# Patient Record
Sex: Female | Born: 1970 | Race: Asian | Hispanic: No | Marital: Married | Smoking: Never smoker
Health system: Southern US, Community
[De-identification: ages and names within clinical notes are randomized; demographics above are authoritative.]

## PROBLEM LIST (undated history)

## (undated) DIAGNOSIS — F329 Major depressive disorder, single episode, unspecified: Secondary | ICD-10-CM

## (undated) DIAGNOSIS — I10 Essential (primary) hypertension: Secondary | ICD-10-CM

## (undated) DIAGNOSIS — F32A Depression, unspecified: Secondary | ICD-10-CM

## (undated) HISTORY — PX: BREAST REDUCTION SURGERY: SHX8

---

## 1997-06-04 ENCOUNTER — Other Ambulatory Visit: Admission: RE | Admit: 1997-06-04 | Discharge: 1997-06-04 | Payer: Self-pay | Admitting: Obstetrics and Gynecology

## 2002-02-28 ENCOUNTER — Other Ambulatory Visit: Admission: RE | Admit: 2002-02-28 | Discharge: 2002-02-28 | Payer: Self-pay | Admitting: Obstetrics and Gynecology

## 2011-08-04 ENCOUNTER — Emergency Department (HOSPITAL_COMMUNITY)
Admission: EM | Admit: 2011-08-04 | Discharge: 2011-08-04 | Disposition: A | Discharge status: Home / Self Care | Payer: BC Managed Care – PPO | Attending: Emergency Medicine | Admitting: Emergency Medicine

## 2011-08-04 ENCOUNTER — Encounter (HOSPITAL_COMMUNITY): Payer: Self-pay | Admitting: Emergency Medicine

## 2011-08-04 DIAGNOSIS — R109 Unspecified abdominal pain: Secondary | ICD-10-CM

## 2011-08-04 DIAGNOSIS — R112 Nausea with vomiting, unspecified: Secondary | ICD-10-CM | POA: Insufficient documentation

## 2011-08-04 HISTORY — DX: Essential (primary) hypertension: I10

## 2011-08-04 HISTORY — DX: Depression, unspecified: F32.A

## 2011-08-04 HISTORY — DX: Major depressive disorder, single episode, unspecified: F32.9

## 2011-08-04 LAB — COMPREHENSIVE METABOLIC PANEL
BUN: 10 mg/dL (ref 6–23)
CO2: 27 mEq/L (ref 19–32)
Calcium: 9.3 mg/dL (ref 8.4–10.5)
Creatinine, Ser: 0.62 mg/dL (ref 0.50–1.10)
GFR calc Af Amer: >90 mL/min (ref >90)
GFR calc non Af Amer: >90 mL/min (ref >90)
Glucose, Bld: 130 mg/dL — ABNORMAL HIGH (ref 70–99)

## 2011-08-04 LAB — CBC
HCT: 38.4 % (ref 36.0–46.0)
MCV: 84.2 fL (ref 78.0–100.0)
RDW: 12.5 % (ref 11.5–15.5)
WBC: 9.7 10*3/uL (ref 4.0–10.5)

## 2011-08-04 LAB — DIFFERENTIAL
Basophils Absolute: 0 10*3/uL (ref 0.0–0.1)
Eosinophils Relative: 1 % (ref 0–5)
Lymphocytes Relative: 16 % (ref 12–46)
Lymphs Abs: 1.6 10*3/uL (ref 0.7–4.0)
Monocytes Absolute: 0.4 10*3/uL (ref 0.1–1.0)
Monocytes Relative: 4 % (ref 3–12)

## 2011-08-04 LAB — PREGNANCY, URINE: Preg Test, Ur: NEGATIVE

## 2011-08-04 LAB — URINALYSIS, ROUTINE W REFLEX MICROSCOPIC
Ketones, ur: NEGATIVE mg/dL
Leukocytes, UA: NEGATIVE
Protein, ur: NEGATIVE mg/dL
Urobilinogen, UA: 0.2 mg/dL (ref 0.0–1.0)

## 2011-08-04 LAB — LIPASE, BLOOD: Lipase: 12 U/L (ref 11–59)

## 2011-08-04 MED ORDER — ONDANSETRON 4 MG PO TBDP: 4.0000 mg | ORAL_TABLET | Freq: Three times a day (TID) | ORAL | Status: DC | PRN

## 2011-08-04 MED ORDER — LANSOPRAZOLE 30 MG PO CPDR: 30.0000 mg | DELAYED_RELEASE_CAPSULE | Freq: Every day | ORAL | Status: DC

## 2011-08-04 NOTE — ED Provider Notes (Signed)
History     CSN: [id number]  Arrival date & time 08/04/11  1043   First MD Initiated Contact with Patient 08/04/11 1045      Chief Complaint  Patient presents with  . Abdominal Pain    (Consider location/radiation/quality/duration/timing/severity/associated sxs/prior treatment) HPI Hx from pt. 41yo F with PMH HTN presents with abdominal pain. Pain is located to the epigastrium and radiates to the RUQ; described as sharp in nature. Pain has been intermittent over the past 2 days but she had a severe episode this morning which was accompanied by nausea and vomiting. Pain improved at this time. No known aggravating/alleviating factors; no association with position, movement, or meals. No family members with similar. She denies urinary sx, vaginal bleeding/dc, changes in bowel movements, changes in appetite, f/c. Denies etoh use. No hx abd surgeries.  (Nursing note reviewed - pt denies pain to RLQ).  Past Medical History  Diagnosis Date  . Depression   . Hypertension     Past Surgical History  Procedure Date  . Breast reduction surgery     History reviewed. No pertinent family history.  History  Substance Use Topics  . Smoking status: Not on file  . Smokeless tobacco: Not on file  . Alcohol Use:     OB History    Grav Para Term Preterm Abortions TAB SAB Ect Mult Living                  Review of Systems  Constitutional: Negative for fever, chills and appetite change.  Respiratory: Negative for cough, chest tightness and shortness of breath.   Cardiovascular: Negative for chest pain.  Gastrointestinal: Positive for nausea, vomiting and abdominal pain. Negative for diarrhea and constipation.  Genitourinary: Negative for dysuria, urgency, vaginal bleeding and vaginal discharge.  Musculoskeletal: Negative for myalgias.  Skin: Negative for color change and rash.  Neurological: Negative for dizziness and weakness.    Allergies  Review of patient's allergies indicates  no known allergies.  Home Medications   Current Outpatient Rx  Name Route Sig Dispense Refill  . WELLBUTRIN PO Oral Take by mouth.      BP 146/97  Pulse 57  Resp 18  SpO2 100%  LMP 07/28/2011  Physical Exam  Nursing note and vitals reviewed. Constitutional: She appears well-developed and well-nourished. No distress.  HENT:  Head: Normocephalic and atraumatic.  Mouth/Throat: Oropharynx is clear and moist. No oropharyngeal exudate.  Eyes: EOM are normal.  Neck: Normal range of motion.  Cardiovascular: Normal rate, regular rhythm and normal heart sounds.   Pulmonary/Chest: Effort normal and breath sounds normal. She exhibits no tenderness.  Abdominal: Soft. Bowel sounds are normal.       Mildly tender to epigastrium without guard or rebound Negative murphy's sign  Musculoskeletal: Normal range of motion.  Neurological: She is alert.  Skin: Skin is warm and dry. She is not diaphoretic.  Psychiatric: She has a normal mood and affect.    ED Course  Procedures (including critical care time)  Labs Reviewed  DIFFERENTIAL - Abnormal; Notable for the following:    Neutrophils Relative 79 (*)     All other components within normal limits  COMPREHENSIVE METABOLIC PANEL - Abnormal; Notable for the following:    Glucose, Bld 130 (*)     All other components within normal limits  URINALYSIS, ROUTINE W REFLEX MICROSCOPIC - Abnormal; Notable for the following:    Hgb urine dipstick TRACE (*)     All other components within normal limits  URINE MICROSCOPIC-ADD ON - Abnormal; Notable for the following:    Squamous Epithelial / LPF FEW (*)     Bacteria, UA FEW (*)     All other components within normal limits  CBC  LIPASE, BLOOD  PREGNANCY, URINE   No results found.   1. Abdominal pain       MDM  Pt with pain to epigastrium/RUQ x 2 days, intermittent in nature. On exam, mildly tender to epigastrium. Labs appear reassuring; no leukocytosis, elevated LFTs/lipase, or evidence  UTI. Pt has been pain free since arrival in ED. With this, do not feel that the pt requires imaging at this time. Will tx as gastritis. Pt states she plans to make a followup with a GI MD whom her husband sees. She was instructed to return for worsening/changing pain or new sx; she verbalized understanding and was agreeable.        Grant Fontana, PA-C 08/04/11 1309

## 2011-08-04 NOTE — ED Notes (Signed)
Per EMS, pt was seen at PCP's office this am for abd pain.  Pt c/o RLQ pain radiating to right flank x 2 days, N/V today.  Denies fever.  Pt sent to ED by PCP.

## 2011-08-04 NOTE — ED Notes (Signed)
ZOX:[id number]<AV> Expected date:08/04/11<BR> Expected time:<BR> Means of arrival:<BR> Comments:<BR> M120- abd pain

## 2011-08-04 NOTE — Discharge Instructions (Signed)
Your lab studies appear normal today. You have been given a prescription for Prevacid. Take this daily. Use the Zofran (nausea/vomiting medication) as needed. Return to the ED with worsening or changing abdominal pain, worsening nausea/vomiting, new symptoms, or with any other concerns.

## 2011-08-04 NOTE — ED Provider Notes (Signed)
Medical screening examination/treatment/procedure(s) were performed by non-physician practitioner and as supervising physician I was immediately available for consultation/collaboration.   Dayton Bailiff, MD 08/04/11 [phone]

## 2011-08-05 ENCOUNTER — Ambulatory Visit (INDEPENDENT_AMBULATORY_CARE_PROVIDER_SITE_OTHER): Payer: BC Managed Care – PPO | Admitting: Internal Medicine

## 2011-08-05 VITALS — BP 145/94 | HR 68 | Temp 97.9°F | Resp 16 | Ht 64.0 in | Wt 233.8 lb

## 2011-08-05 DIAGNOSIS — R350 Frequency of micturition: Secondary | ICD-10-CM

## 2011-08-05 DIAGNOSIS — R1084 Generalized abdominal pain: Secondary | ICD-10-CM

## 2011-08-05 DIAGNOSIS — Z6841 Body Mass Index (BMI) 40.0 and over, adult: Secondary | ICD-10-CM

## 2011-08-05 DIAGNOSIS — F418 Other specified anxiety disorders: Secondary | ICD-10-CM

## 2011-08-05 DIAGNOSIS — R3915 Urgency of urination: Secondary | ICD-10-CM

## 2011-08-05 DIAGNOSIS — R109 Unspecified abdominal pain: Secondary | ICD-10-CM

## 2011-08-05 LAB — POCT URINALYSIS DIPSTICK
Bilirubin, UA: NEGATIVE
Glucose, UA: NEGATIVE
Nitrite, UA: NEGATIVE
Spec Grav, UA: <=1.005

## 2011-08-05 LAB — POCT UA - MICROSCOPIC ONLY: Crystals, Ur, HPF, POC: NEGATIVE

## 2011-08-05 MED ORDER — CIPROFLOXACIN HCL 500 MG PO TABS: 500.0000 mg | ORAL_TABLET | Freq: Two times a day (BID) | ORAL | Status: AC

## 2011-08-05 MED ORDER — TRAMADOL HCL 50 MG PO TABS: ORAL_TABLET | ORAL | Status: DC

## 2011-08-05 NOTE — Progress Notes (Signed)
  Subjective:    Patient ID: Robin Mcdaniel, female    DOB: 04-24-1970, 41 y.o.   MRN: [id number]  HPIComplaining of urinary urgency and frequency with right flank pain since late last night No dysuria or hematuria Last menstrual period one week ago within normal limits/no vaginal discharge Was taken to the ER by EMS yesterday for acute abdominal pain This had been building up in the epigastric area over the last 3-1/2 days and was accompanied by vomiting 3 times just today because the pain was so severe She felt distended with a hard belly At the ER her pain disappeared suddenly Lab work is as noted in the record and did not reveal a diagnosis No radiology studies were done    Review of Systems  Constitutional: Negative for unexpected weight change.  HENT: Negative.   Eyes: Negative.   Respiratory: Negative.   Cardiovascular: Negative.   Gastrointestinal: Negative for diarrhea, constipation and blood in stool.       Today there is no nausea and no vomiting today       Objective:   Physical Exam Filed Vitals:   08/05/11 1453  BP: 145/94  Pulse: 68  Temp: 97.9 F (36.6 C)  Resp: 16  BMI 40  Chest is clear/heart regular without murmur Abdomen is soft nontender and nondistended with no organomegaly and no masses No flank pain tenderness to percussion Extremities clear       Assessment & Plan:   1. Urinary frequency  POCT urinalysis dipstick, POCT UA - Microscopic Only, Urine culture  2. Abdominal  pain, other specified site    3. Urinary urgency    4. Flank pain    5. Elevated blood pressure    6. Body mass index (BMI) of 40.0-44.9 in adult    7. Depression with anxiety  Dr. Inis Sizer pristiq   The differential diagnosis for today's visit would include infection versus stone whereas the emergency room visit sounds more like gallstones or kidney stones Urine culture Start Cipro 500 twice a day Okay for tramadol if needed Followup in 48-72 hours if not  resolved with the likelihood of scheduling abdominal ultrasound

## 2011-08-06 ENCOUNTER — Emergency Department (HOSPITAL_COMMUNITY): Payer: BC Managed Care – PPO

## 2011-08-06 ENCOUNTER — Telehealth: Payer: Self-pay

## 2011-08-06 ENCOUNTER — Encounter (HOSPITAL_COMMUNITY): Payer: Self-pay | Admitting: *Deleted

## 2011-08-06 ENCOUNTER — Emergency Department (HOSPITAL_COMMUNITY)
Admission: EM | Admit: 2011-08-06 | Discharge: 2011-08-06 | Disposition: A | Discharge status: Home / Self Care | Payer: BC Managed Care – PPO | Attending: Emergency Medicine | Admitting: Emergency Medicine

## 2011-08-06 DIAGNOSIS — K7689 Other specified diseases of liver: Secondary | ICD-10-CM | POA: Insufficient documentation

## 2011-08-06 DIAGNOSIS — R109 Unspecified abdominal pain: Secondary | ICD-10-CM

## 2011-08-06 DIAGNOSIS — R350 Frequency of micturition: Secondary | ICD-10-CM | POA: Insufficient documentation

## 2011-08-06 DIAGNOSIS — R112 Nausea with vomiting, unspecified: Secondary | ICD-10-CM | POA: Insufficient documentation

## 2011-08-06 LAB — URINE CULTURE
Colony Count: NO GROWTH
Organism ID, Bacteria: NO GROWTH

## 2011-08-06 LAB — URINALYSIS, ROUTINE W REFLEX MICROSCOPIC
Bilirubin Urine: NEGATIVE
Leukocytes, UA: NEGATIVE
Nitrite: NEGATIVE
Specific Gravity, Urine: 1.019 (ref 1.005–1.030)
Urobilinogen, UA: 0.2 mg/dL (ref 0.0–1.0)
pH: 5 (ref 5.0–8.0)

## 2011-08-06 LAB — URINE MICROSCOPIC-ADD ON

## 2011-08-06 MED ORDER — HYDROCODONE-ACETAMINOPHEN 5-325 MG PO TABS: 1.0000 | ORAL_TABLET | ORAL | Status: AC | PRN

## 2011-08-06 MED ORDER — IOHEXOL 300 MG/ML  SOLN
100.0000 mL | Freq: Once | INTRAMUSCULAR | Status: AC | PRN
  Administered 2011-08-06: 100 mL via INTRAVENOUS

## 2011-08-06 MED ORDER — DICYCLOMINE HCL 20 MG PO TABS: 20.0000 mg | ORAL_TABLET | Freq: Two times a day (BID) | ORAL | Status: DC

## 2011-08-06 NOTE — ED Notes (Signed)
Pt finished with contrast. CT notified and states pt should be next on the list.

## 2011-08-06 NOTE — ED Notes (Signed)
Pt c/o right sided abdominal pain and radiates towards back and urinary frequency. Pt states that pain comes and goes. Pt given Cipro and Tramadol at Urgent Care yesterday.

## 2011-08-06 NOTE — ED Provider Notes (Signed)
History     CSN: [id number]  Arrival date & time 08/06/11  1355   First MD Initiated Contact with Patient 08/06/11 1415      Chief Complaint  Patient presents with  . Flank Pain    (Consider location/radiation/quality/duration/timing/severity/associated sxs/prior treatment) HPI Hx from pt. 41yo F who presents with abdominal pain. This has been intermittent in nature since Friday. She was seen here Friday morning by myself for the same; she did not have pain throughout her ED visit. She had very mild epigastric tenderness but labs were unremarkable. She was discharged and treated for possible gastritis.  Pt states she has been taking the medications as prescribed, but the pain has been persistent. States it has been episodic in nature, with her having episodes each morning several hours after eating breakfast. Pain slowly escalates and then becomes sharp and severe, with episodes lasting about 60-90 minutes. No known aggravating or alleviating factors. She feels as if the previous 2 attacks have been more located to the RUQ with radiation to the back on that side. She has had associated nausea and vomiting with these. Denies urinary sx, vaginal bleeding/dc, changes in bowel movements, changes in appetite, f/c. No hx abd surgeries.  She presented to urgent care yesterday and was given prescriptions for Cipro and tramadol. She has been taking these as prescribed which have not been helpful. She had another attack today and went to urgent care but was told to come to ED for possible imaging.  Past Medical History  Diagnosis Date  . Depression   . Hypertension     Past Surgical History  Procedure Date  . Breast reduction surgery     No family history on file.  History  Substance Use Topics  . Smoking status: Never Smoker   . Smokeless tobacco: Not on file  . Alcohol Use: No    OB History    Grav Para Term Preterm Abortions TAB SAB Ect Mult Living                  Review of  Systems Constitutional: Negative for fever, chills and appetite change.  Respiratory: Negative for cough, chest tightness and shortness of breath.  Cardiovascular: Negative for chest pain.  Gastrointestinal: Positive for nausea, vomiting and abdominal pain. Negative for diarrhea and constipation.  Genitourinary: Negative for dysuria, urgency, vaginal bleeding and vaginal discharge.  Musculoskeletal: Negative for myalgias.  Skin: Negative for color change and rash.  Neurological: Negative for dizziness and weakness.     Allergies  Review of patient's allergies indicates no known allergies.  Home Medications   Current Outpatient Rx  Name Route Sig Dispense Refill  . BUPROPION HCL ER (XL) 300 MG PO TB24 Oral Take 300 mg by mouth daily.    Marland Kitchen CIPROFLOXACIN HCL 500 MG PO TABS Oral Take 1 tablet (500 mg total) by mouth 2 (two) times daily. 20 tablet 0  . IBUPROFEN 200 MG PO TABS Oral Take 400 mg by mouth every 8 (eight) hours as needed. For pain.    Marland Kitchen LANSOPRAZOLE 30 MG PO CPDR Oral Take 1 capsule (30 mg total) by mouth daily. 30 capsule 0  . TRAMADOL HCL 50 MG PO TABS  1-2 every 6 hr as needed for pain 30 tablet 0    BP 130/71  Pulse 63  Temp 98.4 F (36.9 C) (Oral)  Resp 16  Wt 230 lb (104.327 kg)  SpO2 100%  LMP 07/28/2011  Physical Exam  Nursing note and vitals  reviewed. Constitutional: She appears well-developed and well-nourished. No distress.  HENT:  Head: Normocephalic and atraumatic.  Mouth/Throat: Oropharynx is clear and moist. No oropharyngeal exudate.  Eyes:       Normal appearance  Neck: Normal range of motion.  Cardiovascular: Normal rate, regular rhythm and normal heart sounds.  Exam reveals no gallop and no friction rub.   No murmur heard. Pulmonary/Chest: Effort normal and breath sounds normal. She exhibits no tenderness.  Abdominal: Soft. Bowel sounds are normal. There is no tenderness. There is no rebound and no guarding.       Abd is soft without focal  tenderness on exam today. Negative Murphy's sign Negative CVA tenderness  Musculoskeletal: Normal range of motion. She exhibits no edema.  Neurological: She is alert.  Skin: Skin is warm and dry. She is not diaphoretic.  Psychiatric: She has a normal mood and affect.    ED Course  Procedures (including critical care time)  Labs Reviewed  URINALYSIS, ROUTINE W REFLEX MICROSCOPIC - Abnormal; Notable for the following:    Hgb urine dipstick TRACE (*)     All other components within normal limits  URINE MICROSCOPIC-ADD ON - Abnormal; Notable for the following:    Squamous Epithelial / LPF FEW (*)     All other components within normal limits   US Abdomen Complete  08/06/2011  *RADIOLOGY REPORT*  Clinical Data:  Right upper quadrant pain.  COMPLETE ABDOMINAL ULTRASOUND  Comparison:  None.  Findings:  Gallbladder:  No gallstones, gallbladder wall thickening, or pericholecystic fluid.  Common bile duct:  Between 4 mm and 5 mm, normal.  Liver:  Echogenic liver compared to the adjacent right kidney. Small area of hypo echoic echotexture adjacent to the gallbladder fossa is most compatible with focal fatty sparing.  This is a typical location for fatty sparing.  IVC:  Appears normal.  Pancreas:  No focal abnormality seen.  Spleen:  53 mm.  Normal echotexture.  Right Kidney:  11.8 cm. Normal echotexture.  Normal central sinus echo complex.  No calculi or hydronephrosis.  Left Kidney:  11.8 cm. Normal echotexture.  Normal central sinus echo complex.  No calculi or hydronephrosis.  Abdominal aorta:  No aneurysm identified.  IMPRESSION: 1.  No cholelithiasis or cholecystitis.  Normal common bile duct. 2.  Hepatic steatosis with focal fatty sparing adjacent to the gallbladder fossa.  Original Report Authenticated By: Andreas Newport, M.D.   Ct Abdomen Pelvis W Contrast  08/06/2011  *RADIOLOGY REPORT*  Clinical Data: Urinary urgency and frequency.  Right flank pain.  CT ABDOMEN AND PELVIS WITH CONTRAST   Technique:  Multidetector CT imaging of the abdomen and pelvis was performed following the standard protocol during bolus administration of intravenous contrast.  Contrast: OMNIPAQUE IOHEXOL 300 MG/ML  SOLN  Comparison: None.  Findings: Mild dependent atelectasis at the lung bases.  Low attenuation of the liver suggests hepatic steatosis.  High attenuation of the liver is present adjacent to the gallbladder fossa, the typical location for focal fatty sparing.  The Focal area of either fatty sparing or a hypervascular lesion is present in the posterior right hepatic lobe measuring 42 mm x 46 mm (image 19 series 2).  This does not have a typical appearance of cavernous hemangioma.  Followup MRI recommended.  No calcified gallstones.  The common bile duct and pancreas appear normal. Spleen normal.  The adrenal glands normal.  Early excretion of contrast in the kidneys is present on the left.  Both ureters appear normal.  Normal delayed excretion of contrast from the kidneys.  Stomach and proximal small bowel normal.  Normal appendix.  The colon appears within normal limits.  Uterus and adnexa have a normal physiologic appearance.  Normal urinary bladder.  No adenopathy.  Vasculature grossly normal.  No aggressive osseous lesions are present.  Mild lumbar spondylosis.  IMPRESSION: 1.  No acute abnormality. 2.  46 mm x 42 mm lesion in the posterior right hepatic lobe is nonspecific on CT.  This is indeterminant.  Follow-up MRI with and without infusion recommended for further evaluation. Non-emergent MRI should be deferred until patient has been discharged for the acute illness, and can optimally cooperate with positioning and breath-holding instructions. 3.  Probable fatty liver.  Original Report Authenticated By: Andreas Newport, M.D.     1. Abdominal pain       MDM  Pt with abd pain which has been intermittent in nature since Fri with daily "attacks." Reassuring bloodwork here on Friday, so bloodwork  was not repeated today. Imaging does not show any acute findings such as gallbladder or renal pathology. There is an incidental finding of ?fatty sparing seen on CT which radiology recommends following up with MRI - this does not explain the cause of the patient's pain today. Pt is aware that she will need to follow up with her PCP for this. Rxes for Bentyl, Norco. Instructed to cont Prevacid. Return precautions discussed.        Grant Fontana, PA-C 08/06/11 2035

## 2011-08-06 NOTE — ED Notes (Signed)
Patient transported to CT 

## 2011-08-06 NOTE — ED Notes (Signed)
Pt states "was seen here 1st, then yesterday went to [facility] because the pain came back & was so severe, they gave me 2 medicines that I'm taking, Cipro & tramadol but the pain was again today severe, they told me to come here because they couldn't do sonography"; pt c/o right flank pain

## 2011-08-06 NOTE — ED Notes (Signed)
US finished in room.

## 2011-08-06 NOTE — Discharge Instructions (Signed)
Your imaging today did not show any evidence of gallbladder or kidney problems, which is reassuring. There was an area of possible "fatty sparing" on your liver which you will need to have followed up with an MRI within the next several weeks which can be scheduled by your primary care doctor; this is not the cause of your pain today. If your pain recurs, please take the Bentyl for spasm. You may also take the Norco for pain as well. Start the antacid medication which was prescribed on Friday. Please plan to follow up with your primary care doctor for further evaluation and treatment.

## 2011-08-06 NOTE — Telephone Encounter (Signed)
Pt is in terrible pain and was here yesterday to see dr Merla Riches would like him to call the hospital or him to call her and let her know what to do

## 2011-08-07 NOTE — ED Provider Notes (Signed)
Medical screening examination/treatment/procedure(s) were performed by non-physician practitioner and as supervising physician I was immediately available for consultation/collaboration.  Cheri Guppy, MD 08/07/11 [phone]

## 2011-08-09 ENCOUNTER — Other Ambulatory Visit (HOSPITAL_COMMUNITY): Payer: Self-pay | Admitting: Family Medicine

## 2011-08-09 DIAGNOSIS — R1011 Right upper quadrant pain: Secondary | ICD-10-CM

## 2011-08-15 ENCOUNTER — Other Ambulatory Visit (HOSPITAL_COMMUNITY): Payer: BC Managed Care – PPO

## 2011-08-18 ENCOUNTER — Ambulatory Visit (HOSPITAL_COMMUNITY)
Admission: RE | Admit: 2011-08-18 | Discharge: 2011-08-18 | Disposition: A | Discharge status: Home / Self Care | Payer: BC Managed Care – PPO | Source: Ambulatory Visit | Attending: Family Medicine | Admitting: Family Medicine

## 2011-08-18 DIAGNOSIS — R1011 Right upper quadrant pain: Secondary | ICD-10-CM

## 2011-08-18 MED ORDER — TECHNETIUM TC 99M MEBROFENIN IV KIT
5.0000 | PACK | Freq: Once | INTRAVENOUS | Status: AC | PRN
  Administered 2011-08-18: 5 via INTRAVENOUS

## 2011-08-18 MED ORDER — SINCALIDE 5 MCG IJ SOLR
0.0200 ug/kg | Freq: Once | INTRAMUSCULAR | Status: AC
  Administered 2011-08-18: 2.09 ug via INTRAVENOUS

## 2011-08-18 MED ORDER — SINCALIDE 5 MCG IJ SOLR
INTRAMUSCULAR | Status: AC
  Administered 2011-08-18: 2.09 ug via INTRAVENOUS
  Filled 2011-08-18: qty 10

## 2014-04-13 ENCOUNTER — Other Ambulatory Visit: Payer: Self-pay | Admitting: Obstetrics and Gynecology

## 2014-04-13 DIAGNOSIS — R928 Other abnormal and inconclusive findings on diagnostic imaging of breast: Secondary | ICD-10-CM

## 2014-04-23 ENCOUNTER — Other Ambulatory Visit: Payer: Self-pay | Admitting: Obstetrics and Gynecology

## 2014-04-27 ENCOUNTER — Ambulatory Visit
Admission: RE | Admit: 2014-04-27 | Discharge: 2014-04-27 | Disposition: A | Discharge status: Home / Self Care | Payer: BLUE CROSS/BLUE SHIELD | Source: Ambulatory Visit | Attending: Obstetrics and Gynecology | Admitting: Obstetrics and Gynecology

## 2014-04-27 DIAGNOSIS — R928 Other abnormal and inconclusive findings on diagnostic imaging of breast: Secondary | ICD-10-CM

## 2014-05-21 NOTE — Telephone Encounter (Signed)
Issue was addressed 

## 2015-01-05 ENCOUNTER — Other Ambulatory Visit: Payer: Self-pay | Admitting: Obstetrics and Gynecology

## 2015-01-05 DIAGNOSIS — N6320 Unspecified lump in the left breast, unspecified quadrant: Secondary | ICD-10-CM

## 2015-01-08 ENCOUNTER — Ambulatory Visit
Admission: RE | Admit: 2015-01-08 | Discharge: 2015-01-08 | Disposition: A | Discharge status: Home / Self Care | Payer: BLUE CROSS/BLUE SHIELD | Source: Ambulatory Visit | Attending: Obstetrics and Gynecology | Admitting: Obstetrics and Gynecology

## 2015-01-08 DIAGNOSIS — N6320 Unspecified lump in the left breast, unspecified quadrant: Secondary | ICD-10-CM

## 2015-07-05 ENCOUNTER — Other Ambulatory Visit: Payer: Self-pay | Admitting: Obstetrics and Gynecology

## 2015-07-05 DIAGNOSIS — N63 Unspecified lump in unspecified breast: Secondary | ICD-10-CM

## 2015-07-09 ENCOUNTER — Ambulatory Visit
Admission: RE | Admit: 2015-07-09 | Discharge: 2015-07-09 | Disposition: A | Discharge status: Home / Self Care | Payer: BLUE CROSS/BLUE SHIELD | Source: Ambulatory Visit | Attending: Obstetrics and Gynecology | Admitting: Obstetrics and Gynecology

## 2015-07-09 DIAGNOSIS — N63 Unspecified lump in unspecified breast: Secondary | ICD-10-CM

## 2017-03-12 ENCOUNTER — Encounter: Payer: Self-pay | Admitting: Podiatry

## 2017-03-12 ENCOUNTER — Ambulatory Visit (INDEPENDENT_AMBULATORY_CARE_PROVIDER_SITE_OTHER): Payer: BLUE CROSS/BLUE SHIELD

## 2017-03-12 ENCOUNTER — Ambulatory Visit (INDEPENDENT_AMBULATORY_CARE_PROVIDER_SITE_OTHER): Payer: BLUE CROSS/BLUE SHIELD | Admitting: Podiatry

## 2017-03-12 DIAGNOSIS — M722 Plantar fascial fibromatosis: Secondary | ICD-10-CM | POA: Diagnosis not present

## 2017-03-12 MED ORDER — DICLOFENAC SODIUM 75 MG PO TBEC
75.0000 mg | DELAYED_RELEASE_TABLET | Freq: Two times a day (BID) | ORAL | 2 refills | Status: DC
Start: 2017-03-12 — End: 2018-11-27

## 2017-03-12 MED ORDER — TRIAMCINOLONE ACETONIDE 10 MG/ML IJ SUSP
10.0000 mg | Freq: Once | INTRAMUSCULAR | Status: AC
  Administered 2017-03-12: 10 mg

## 2017-03-12 NOTE — Patient Instructions (Signed)

## 2017-03-14 NOTE — Progress Notes (Signed)
Subjective:   Patient ID: Robin Mcdaniel, female   DOB: 47 y.o.   MRN: [id number]   HPI Patient presents with exquisite discomfort plantar aspect right heel at the insertional point of the tendon into the calcaneus with inflammation and fluid around the medial band.  Patient states this is been going on for a number of months patient does not smoke and likes to be active   Review of Systems  All other systems reviewed and are negative.       Objective:  Physical Exam  Constitutional: She appears well-developed and well-nourished.  Cardiovascular: Intact distal pulses.  Pulmonary/Chest: Effort normal.  Musculoskeletal: Normal range of motion.  Neurological: She is alert.  Skin: Skin is warm.  Nursing note and vitals reviewed.   Neurovascular status intact muscle strength adequate range of motion within normal limits with exquisite discomfort plantar aspect of the right heel at the insertional point tendon calcaneus with fluid buildup noted.  Patient does have moderate depression of the arch and is found to have good digital perfusion     Assessment:  Acute plantar fasciitis right with history of this in the long-term orthotics which are no longer effective     Plan:  H&P x-ray reviewed and today injected the plantar fascia right 3 mg Kenalog 5 mg Xylocaine and applied fascial brace.  Gave instructions on physical therapy discussed long-term orthotics and reappoint to recheck again in the next several weeks  X-rays indicate small spur with no indications of stress fracture or advanced arthritis

## 2017-03-26 ENCOUNTER — Encounter: Payer: Self-pay | Admitting: Podiatry

## 2017-03-26 ENCOUNTER — Ambulatory Visit (INDEPENDENT_AMBULATORY_CARE_PROVIDER_SITE_OTHER): Payer: BLUE CROSS/BLUE SHIELD | Admitting: Podiatry

## 2017-03-26 DIAGNOSIS — M722 Plantar fascial fibromatosis: Secondary | ICD-10-CM | POA: Diagnosis not present

## 2017-03-26 MED ORDER — TRIAMCINOLONE ACETONIDE 10 MG/ML IJ SUSP
10.0000 mg | Freq: Once | INTRAMUSCULAR | Status: AC
  Administered 2017-03-26: 10 mg

## 2017-03-28 NOTE — Progress Notes (Signed)
Subjective:   Patient ID: Robin Mcdaniel, female   DOB: 47 y.o.   MRN: [id number]   HPI Patient presents stating I am improving but still having quite a bit of discomfort and if I am on my foot for too long of a time it still gets very sore.  This has been occurring for about 6 months neurovascular status intact with inflammation pain of the plantar heel right still present   ROS      Objective:  Physical Exam  Upon deep palpation with inflammation fluid around the medial band     Assessment:  Improving but continued plantar fasciitis right with inflammation fluid around the medial band     Plan:  H&P condition reviewed and careful injection administered medial under sterile technique with 3 mg Kenalog 5 mg Xylocaine.  I then discussed orthotics and scan for customized orthotic devices to reduce the stress on the heel

## 2017-04-05 ENCOUNTER — Other Ambulatory Visit: Payer: BLUE CROSS/BLUE SHIELD | Admitting: Orthotics

## 2017-04-13 ENCOUNTER — Other Ambulatory Visit: Payer: Self-pay | Admitting: Obstetrics and Gynecology

## 2017-04-13 DIAGNOSIS — N63 Unspecified lump in unspecified breast: Secondary | ICD-10-CM

## 2017-04-23 ENCOUNTER — Ambulatory Visit: Payer: BLUE CROSS/BLUE SHIELD

## 2017-04-23 ENCOUNTER — Ambulatory Visit
Admission: RE | Admit: 2017-04-23 | Discharge: 2017-04-23 | Disposition: A | Discharge status: Home / Self Care | Payer: BLUE CROSS/BLUE SHIELD | Source: Ambulatory Visit | Attending: Obstetrics and Gynecology | Admitting: Obstetrics and Gynecology

## 2017-04-23 DIAGNOSIS — N63 Unspecified lump in unspecified breast: Secondary | ICD-10-CM

## 2017-05-31 ENCOUNTER — Encounter: Payer: Self-pay | Admitting: Podiatry

## 2017-05-31 ENCOUNTER — Ambulatory Visit (INDEPENDENT_AMBULATORY_CARE_PROVIDER_SITE_OTHER): Payer: BLUE CROSS/BLUE SHIELD | Admitting: Podiatry

## 2017-05-31 DIAGNOSIS — M722 Plantar fascial fibromatosis: Secondary | ICD-10-CM

## 2017-05-31 MED ORDER — TRIAMCINOLONE ACETONIDE 10 MG/ML IJ SUSP
10.0000 mg | Freq: Once | INTRAMUSCULAR | Status: AC
  Administered 2017-05-31: 10 mg

## 2017-05-31 NOTE — Patient Instructions (Signed)

## 2017-06-01 NOTE — Progress Notes (Signed)
Subjective:   Patient ID: Robin Mcdaniel, female   DOB: 47 y.o.   MRN: [id number]   HPI Patient states she was doing well but her foot started to hurt again after being active and she is getting ready to leave the country for a number of months   ROS      Objective:  Physical Exam  Neurovascular status intact with exquisite discomfort in the medial aspect of the plantar fascia right with inflammation fluid buildup     Assessment:  Reoccurrence plantar fasciitis right     Plan:  H&P and today I did inject the right plantar fascia 3 mg Kenalog 5 mg Xylocaine and advised on orthotics which were dispensed today.  Reappoint as needed when she returns to town

## 2017-06-11 ENCOUNTER — Telehealth: Payer: Self-pay | Admitting: *Deleted

## 2017-06-11 NOTE — Telephone Encounter (Signed)
I spoke with pt and she states she went walking in the new orthotic and it made her foot hurt so bad she was crying before she got home, pt states [place] but [place] now. I asked pt if she could come in to see the pedorthist for possible modifications, but she is leaving the country tomorrow for 8 months. I told pt I would make a note for Dr. Marice Potteregal, Rick our pedorthist and our office manager, so when she returned we could work with her.

## 2017-06-11 NOTE — Telephone Encounter (Signed)
Sounds good to me

## 2017-06-11 NOTE — Telephone Encounter (Signed)
Pt states the right orthotic has made the right foot hurt worse, and she does not want to pay for them.

## 2018-06-23 IMAGING — [facility] DIGITAL DIAGNOSTIC BILATERAL MAMMOGRAM WITH TOMO AND CAD
6 of 9 series · 6 of 25 positions shown · non-contrast
Comparison: [DATE] [DATE], [DATE], [DATE] [DATE], [DATE], [DATE] [DATE], [DATE],
[DATE] [DATE], [DATE], [DATE] [DATE], [DATE]

CLINICAL DATA: 46-year-old patient presents for delayed follow-up
of probably benign nodule related to the fat necrosis in the left
breast, lower outer quadrant. She has a history breast reduction.
She is asymptomatic. Her last breast imaging here was in Sunday June, 2015.

EXAM:
DIGITAL DIAGNOSTIC BILATERAL MAMMOGRAM WITH CAD AND TOMO

[L MLO]
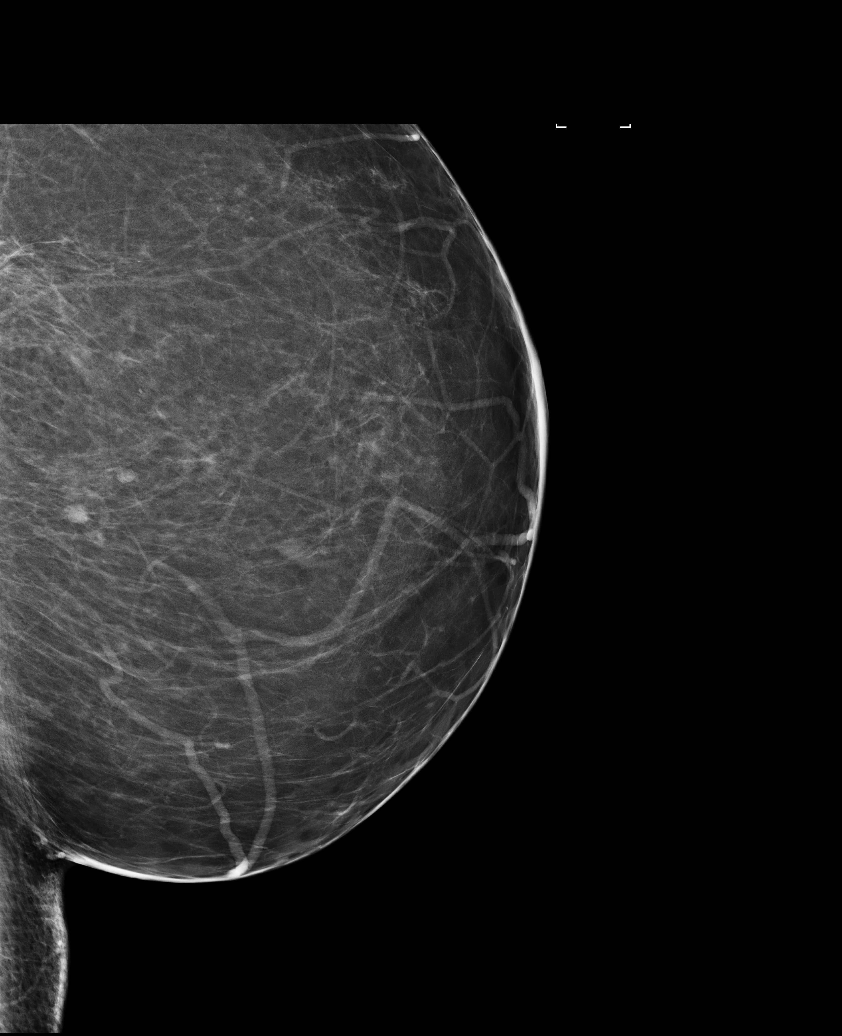

[L MLO synth-2D]
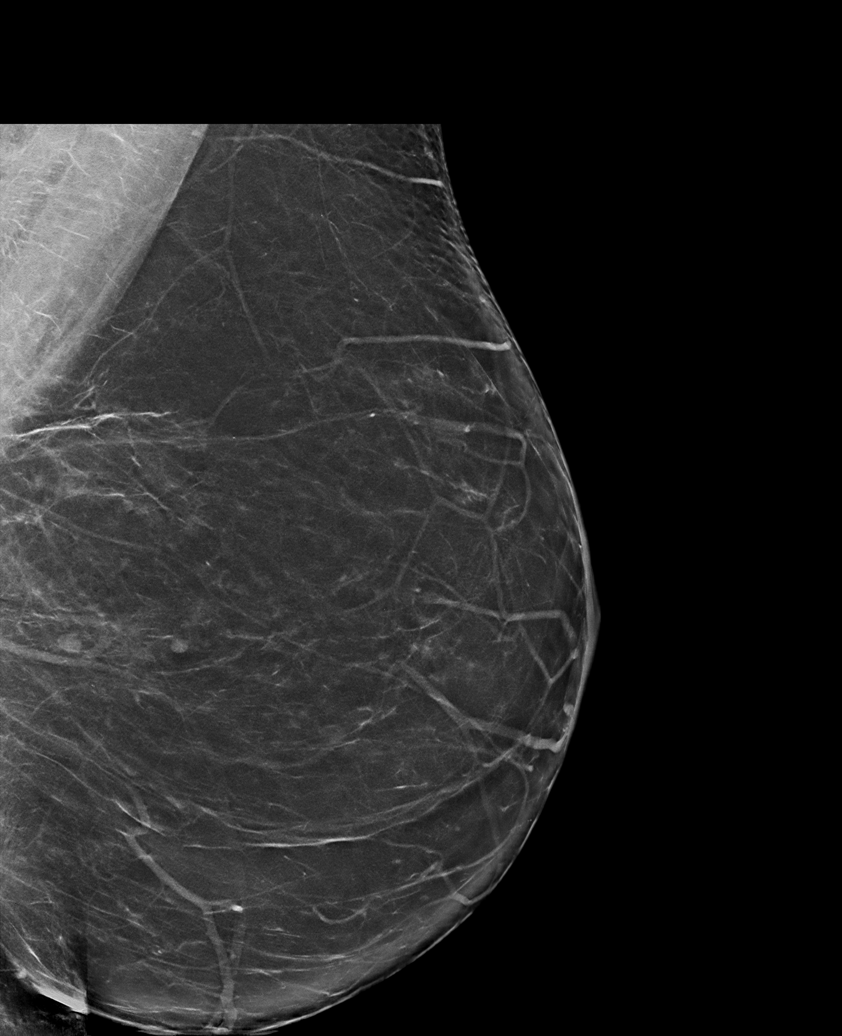

[L CC synth-2D]
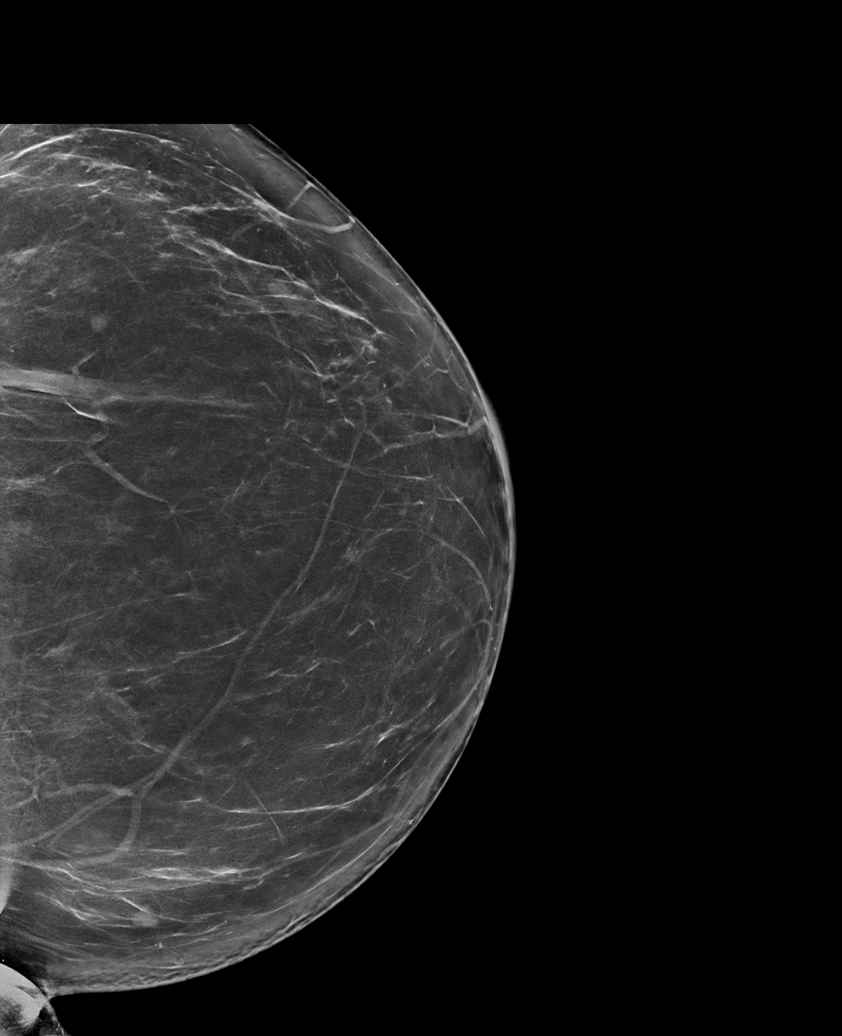

[R MLO synth-2D]
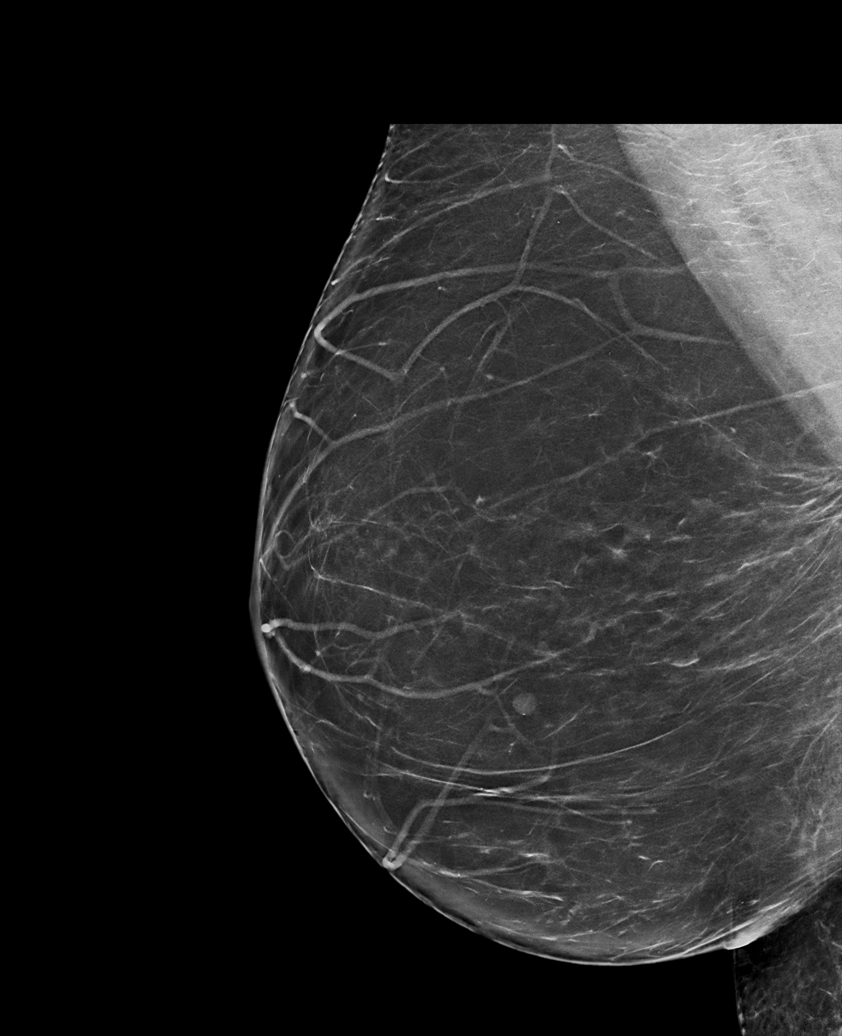

[R CC synth-2D]
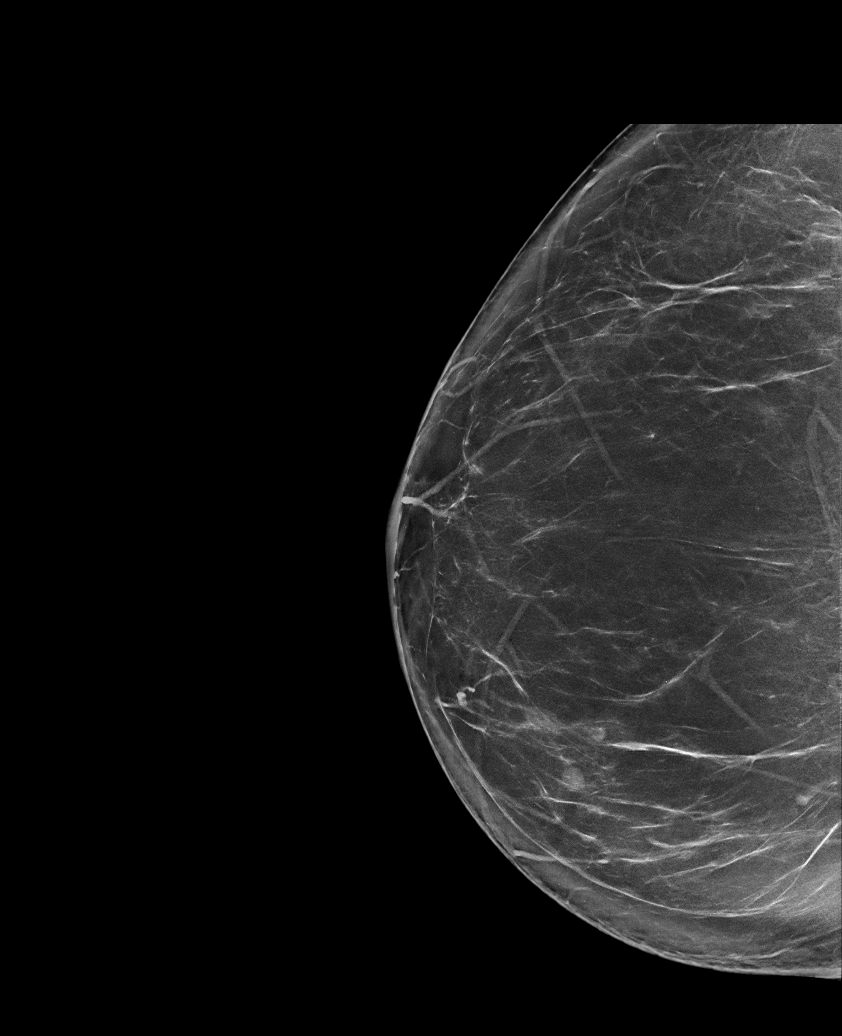

[R CC tomo · tomo slice 45/89.0]
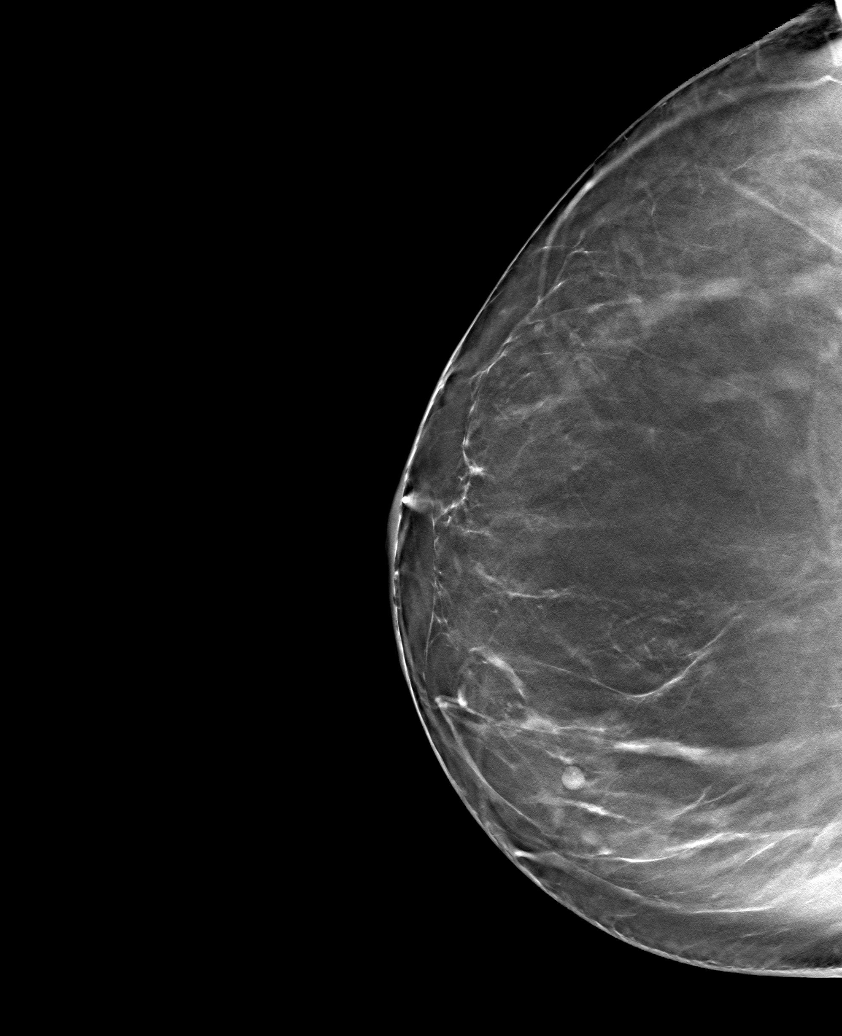

[6 of 25 positions shown; findings below may reference images not displayed]

ACR Breast Density Category b: There are scattered areas of
fibroglandular density.
FINDINGS: Previously described circumscribed nodule in the lower outer
quadrant of the left breast is mammographically stable. There are a
few waxing and waning subcentimeter circumscribed nodules
bilaterally, a benign finding. No suspicious mass, architectural
distortion, or suspicious microcalcification is identified in either
breast to suggest malignancy.

Mammographic images were processed with CAD.
IMPRESSION: No evidence of malignancy in either breast.

RECOMMENDATION:
Screening mammogram in one year.(Code:[id number])

I have discussed the findings and recommendations with the patient.
Results were also provided in writing at the conclusion of the
visit. If applicable, a reminder letter will be sent to the patient
regarding the next appointment.

BI-RADS CATEGORY  2: Benign.

## 2018-11-26 DIAGNOSIS — I10 Essential (primary) hypertension: Secondary | ICD-10-CM | POA: Diagnosis not present

## 2018-11-26 DIAGNOSIS — E559 Vitamin D deficiency, unspecified: Secondary | ICD-10-CM | POA: Diagnosis not present

## 2018-11-26 DIAGNOSIS — E119 Type 2 diabetes mellitus without complications: Secondary | ICD-10-CM | POA: Diagnosis not present

## 2018-11-26 DIAGNOSIS — E785 Hyperlipidemia, unspecified: Secondary | ICD-10-CM | POA: Diagnosis not present

## 2018-11-27 ENCOUNTER — Ambulatory Visit (INDEPENDENT_AMBULATORY_CARE_PROVIDER_SITE_OTHER): Payer: BC Managed Care – PPO | Admitting: Podiatry

## 2018-11-27 ENCOUNTER — Other Ambulatory Visit: Payer: Self-pay

## 2018-11-27 ENCOUNTER — Ambulatory Visit (INDEPENDENT_AMBULATORY_CARE_PROVIDER_SITE_OTHER): Payer: BC Managed Care – PPO

## 2018-11-27 ENCOUNTER — Encounter: Payer: Self-pay | Admitting: Podiatry

## 2018-11-27 ENCOUNTER — Other Ambulatory Visit: Payer: Self-pay | Admitting: Podiatry

## 2018-11-27 DIAGNOSIS — M722 Plantar fascial fibromatosis: Secondary | ICD-10-CM

## 2018-11-27 DIAGNOSIS — M76821 Posterior tibial tendinitis, right leg: Secondary | ICD-10-CM

## 2018-11-27 DIAGNOSIS — M25571 Pain in right ankle and joints of right foot: Secondary | ICD-10-CM

## 2018-12-01 NOTE — Progress Notes (Signed)
Subjective:   Patient ID: Robin Mcdaniel, female   DOB: 48 y.o.   MRN: [id number]   HPI Patient states my heels seem to be pretty good but I am getting quite a bit of discomfort on the inside of my right ankle and I do still get heel pain if I am too active   ROS      Objective:  Physical Exam  Neurovascular status intact with plantar heel being mildly tender bilateral with moderate depression of the arch and quite a bit of inflammation of the posterior tibial tendon as it comes under the medial malleolus it inserts into the navicular     Assessment:  Acute posterior tibial tendinitis right with plantar fascial symptomatology is secondary problem     Plan:  H&P reviewed both conditions and for the plantar fascia I recommended continued orthotic usage along with supportive shoes stretching exercises.  Sterile prep and injected the posterior tib 3 mg Dexasone Kenalog 5 mg Xylocaine to try to take pressure off the tendon and advised on reduced activity  X-rays were negative for signs of fracture did indicate depression of the arch with spur formation

## 2018-12-12 DIAGNOSIS — T7840XS Allergy, unspecified, sequela: Secondary | ICD-10-CM | POA: Diagnosis not present

## 2018-12-12 DIAGNOSIS — I10 Essential (primary) hypertension: Secondary | ICD-10-CM | POA: Diagnosis not present

## 2018-12-18 ENCOUNTER — Ambulatory Visit: Payer: BC Managed Care – PPO | Admitting: Podiatry

## 2018-12-19 ENCOUNTER — Ambulatory Visit: Payer: BC Managed Care – PPO | Admitting: Podiatry

## 2018-12-19 ENCOUNTER — Other Ambulatory Visit: Payer: BC Managed Care – PPO | Admitting: Orthotics

## 2019-04-21 ENCOUNTER — Ambulatory Visit: Payer: BC Managed Care – PPO | Admitting: Cardiology

## 2019-04-21 ENCOUNTER — Other Ambulatory Visit: Payer: Self-pay

## 2019-04-21 ENCOUNTER — Encounter: Payer: Self-pay | Admitting: Cardiology

## 2019-04-21 VITALS — BP 139/94 | HR 67 | Temp 97.1°F | Resp 16 | Ht 64.0 in | Wt 224.4 lb

## 2019-04-21 DIAGNOSIS — R0609 Other forms of dyspnea: Secondary | ICD-10-CM

## 2019-04-21 DIAGNOSIS — I10 Essential (primary) hypertension: Secondary | ICD-10-CM

## 2019-04-21 DIAGNOSIS — R0600 Dyspnea, unspecified: Secondary | ICD-10-CM | POA: Diagnosis not present

## 2019-04-21 MED ORDER — AMLODIPINE BESYLATE 5 MG PO TABS: 5.0000 mg | ORAL_TABLET | Freq: Every day | ORAL | 2 refills | Status: DC

## 2019-04-21 NOTE — Progress Notes (Signed)
Primary Physician/Referring:  Dois Davenport, MD  Patient ID: Robin Mcdaniel, female    DOB: 06-Jun-1970, 49 y.o.   MRN: [id number]  Chief Complaint  Patient presents with  . Hypertension  . Shortness of Breath    Follow up   HPI:    Robin Mcdaniel  is a 49 y.o. Arabic female who presents for evaluation of dyspnea on exertion and also has noticed her BP is not well controlled. Symptoms started 2 weeks ago.  No leg edema, no chest pain. She travels between [place] and [place], I last seen her  4 years ago. She states that her diastolic pressures remained constantly high. She is having difficulty with weight loss. No chest pain or palpitations. No leg edema.    Past Medical History:  Diagnosis Date  . Depression   . Hypertension    Past Surgical History:  Procedure Laterality Date  . BREAST REDUCTION SURGERY     Family History  Problem Relation Age of Onset  . Diabetes Mother   . Hypertension Mother   . Alzheimer's disease Father   . Diabetes Sister   . Diabetes Brother     Social History   Tobacco Use  . Smoking status: Never Smoker  . Smokeless tobacco: Never Used  Substance Use Topics  . Alcohol use: No   ROS  Review of Systems  Cardiovascular: Positive for dyspnea on exertion. Negative for chest pain and leg swelling.  Gastrointestinal: Negative for melena.   Objective  Blood pressure (!) 139/94, pulse 67, temperature (!) 97.1 F (36.2 C), temperature source Temporal, resp. rate 16, height 5\' 4"  (1.626 m), weight 224 lb 6.4 oz (101.8 kg), SpO2 98 %.  Vitals with BMI 04/21/2019 08/06/2011 08/06/2011  Height 5\' 4"  - -  Weight 224 lbs 6 oz - -  BMI 38.5 - -  Systolic 139 127 08/08/2011  Diastolic 94 84 71  Pulse 67 64 63     Physical Exam  Constitutional:  Moderately obese  Cardiovascular: Normal rate, regular rhythm, normal heart sounds and intact distal pulses. Exam reveals no gallop.  No murmur heard. No leg edema, no JVD.  Pulmonary/Chest: Effort normal and breath  sounds normal.  Abdominal: Soft. Bowel sounds are normal.   Laboratory examination:   No results for input(s): NA, K, CL, CO2, GLUCOSE, BUN, CREATININE, CALCIUM, GFRNONAA, GFRAA in the last 8760 hours. CrCl cannot be calculated (Patient's most recent lab result is older than the maximum 21 days allowed.).  CMP Latest Ref Rng & Units 08/04/2011  Glucose 70 - 99 mg/dL 387)  BUN 6 - 23 mg/dL 10  Creatinine 08/06/2011 - 564(P mg/dL 3.29  Sodium 5.18 - 8.41 mEq/L 138  Potassium 3.5 - 5.1 mEq/L 4.2  Chloride 96 - 112 mEq/L 104  CO2 19 - 32 mEq/L 27  Calcium 8.4 - 10.5 mg/dL 9.3  Total Protein 6.0 - 8.3 g/dL 7.2  Total Bilirubin 0.3 - 1.2 mg/dL 0.3  Alkaline Phos 39 - 117 U/L 77  AST 0 - 37 U/L 14  ALT 0 - 35 U/L 15   CBC Latest Ref Rng & Units 08/04/2011  WBC 4.0 - 10.5 K/uL 9.7  Hemoglobin 12.0 - 15.0 g/dL 630  Hematocrit 08/06/2011 - 46.0 % 38.4  Platelets 150 - 400 K/uL 248   Lipid Panel  No results found for: CHOL, TRIG, HDL, CHOLHDL, VLDL, LDLCALC, LDLDIRECT HEMOGLOBIN A1C No results found for: HGBA1C, MPG TSH No results for input(s): TSH in the last  8760 hours.  External labs:    Medications and allergies  No Known Allergies   Current Outpatient Medications  Medication Instructions  . losartan-hydrochlorothiazide (HYZAAR) 100-12.5 MG tablet 1 tablet, Oral, Daily  . sertraline (ZOLOFT) 50 MG tablet Oral  . Vitamin D, Cholecalciferol, 25 MCG (1000 UT) CAPS Oral    Radiology:   No results found.  Cardiac Studies:   Echocardiogram 05/06/2014: Left ventricle cavity is normal in size. Normal global wall motion. Normal diastolic filling pattern. Calculated EF 69%.  Left atrial cavity is mildly dilated.  Right ventricle cavity is normal in size and function. Moderator band at the apex(normal variant).  Event Monitor 04/16/2014 (30 days): NSR. S. Tachycardia @ 160/min, no symptoms reported.  EKG 04/21/2019: Normal sinus rhythm with rate of 60 bpm, normal axis.  No evidence of  ischemia, normal EKG.   WITHIN NORMAL LIMITS    Assessment     ICD-10-CM   1. Dyspnea on exertion  R06.00   2. Primary hypertension  I10 EKG 12-Lead      No orders of the defined types were placed in this encounter.   There are no discontinued medications.   Recommendations:   Robin Mcdaniel  is a 49 y.o. Arabic female who presents for evaluation of dyspnea on exertion and also has noticed her BP is not well controlled. Symptoms started 2 weeks ago.   I will add amlodipine 5 mg daily, she probably will need 10 mg.  Suspect elevated blood pressure is related to obesity and sedentary lifestyle. Will schedule for an echocardiogram.  I will see her back in 6 weeks for follow-up.  Have asked for labs from PCP. Weight loss discussed and diet discussed with regards to hypertension.  Adrian Prows, MD, [facility] 04/21/2019, 1:41 PM [facility]. [place] Office: [phone]

## 2019-05-07 DIAGNOSIS — F411 Generalized anxiety disorder: Secondary | ICD-10-CM | POA: Diagnosis not present

## 2019-05-07 DIAGNOSIS — F4312 Post-traumatic stress disorder, chronic: Secondary | ICD-10-CM | POA: Diagnosis not present

## 2019-05-07 DIAGNOSIS — F331 Major depressive disorder, recurrent, moderate: Secondary | ICD-10-CM | POA: Diagnosis not present

## 2019-05-09 ENCOUNTER — Telehealth: Payer: Self-pay

## 2019-05-09 DIAGNOSIS — I10 Essential (primary) hypertension: Secondary | ICD-10-CM

## 2019-05-09 MED ORDER — ATENOLOL 50 MG PO TABS: 50.0000 mg | ORAL_TABLET | Freq: Two times a day (BID) | ORAL | 2 refills | Status: AC

## 2019-05-09 MED ORDER — AMLODIPINE BESYLATE 10 MG PO TABS: 10.0000 mg | ORAL_TABLET | Freq: Every day | ORAL | 2 refills | Status: AC

## 2019-05-09 NOTE — Telephone Encounter (Signed)
Patient called and said that her BP is still running high, its been as high as 158/101 and 140/99. Please advise.

## 2019-05-09 NOTE — Telephone Encounter (Signed)
Patient has uncontrolled hypertension I will increase amlodipine to 10 mg. I will also had atenolol 50 mg p.o. twice daily.

## 2019-05-13 ENCOUNTER — Other Ambulatory Visit: Payer: Self-pay | Admitting: Cardiology

## 2019-05-13 ENCOUNTER — Telehealth: Payer: Self-pay

## 2019-05-13 DIAGNOSIS — I10 Essential (primary) hypertension: Secondary | ICD-10-CM

## 2019-05-13 MED ORDER — SPIRONOLACTONE 25 MG PO TABS: 25.0000 mg | ORAL_TABLET | ORAL | 2 refills | Status: AC

## 2019-05-13 NOTE — Telephone Encounter (Signed)
I sent in for Aldactone 25 mg in the morning. Continue all her medications and start aldactone every morning and check BMP in 2 weeks. Orders placed. I sent her my chart message

## 2019-05-13 NOTE — Telephone Encounter (Signed)
VM 106: Patient left a message on VM stating that her BP medications was increased and now her BP has worsened. She asked if this should be happening and what should she do now. Please advise.

## 2019-05-14 NOTE — Telephone Encounter (Signed)
Tried calling patient, NA, LMAM.

## 2019-05-16 NOTE — Telephone Encounter (Signed)
Tried calling patient several times and both numbers on file either has someone else's name on VM, or it has a fax tone.

## 2019-06-02 DIAGNOSIS — F331 Major depressive disorder, recurrent, moderate: Secondary | ICD-10-CM | POA: Diagnosis not present

## 2019-06-02 DIAGNOSIS — F4312 Post-traumatic stress disorder, chronic: Secondary | ICD-10-CM | POA: Diagnosis not present

## 2019-06-02 DIAGNOSIS — F411 Generalized anxiety disorder: Secondary | ICD-10-CM | POA: Diagnosis not present

## 2019-06-03 ENCOUNTER — Ambulatory Visit: Payer: BC Managed Care – PPO | Admitting: Cardiology

## 2019-06-04 ENCOUNTER — Telehealth: Payer: Self-pay

## 2019-06-04 ENCOUNTER — Ambulatory Visit: Payer: BC Managed Care – PPO | Admitting: Cardiology

## 2019-06-04 NOTE — Telephone Encounter (Signed)
Relayed information to patient and suggested patient follow up with PCP. Patient voiced understanding.

## 2019-06-04 NOTE — Telephone Encounter (Signed)
I thinks she should get her blood sugar checked with PCP, highly doubt the medications are doing this and I suspect she is now diabetic

## 2019-06-04 NOTE — Telephone Encounter (Signed)
Patient called and stated that she believes she is having a bad reaction to her medications Amlodipine and/or Atenolol. Patient stated she is very fatigued and BG is running high. BG yesterday was 380 and today it was 210. Please advise. Thanks!

## 2019-06-16 DIAGNOSIS — E785 Hyperlipidemia, unspecified: Secondary | ICD-10-CM | POA: Diagnosis not present

## 2019-06-16 DIAGNOSIS — Z0001 Encounter for general adult medical examination with abnormal findings: Secondary | ICD-10-CM | POA: Diagnosis not present

## 2019-06-16 DIAGNOSIS — I10 Essential (primary) hypertension: Secondary | ICD-10-CM | POA: Diagnosis not present

## 2019-06-23 DIAGNOSIS — E119 Type 2 diabetes mellitus without complications: Secondary | ICD-10-CM | POA: Diagnosis not present

## 2019-06-26 DIAGNOSIS — Z1231 Encounter for screening mammogram for malignant neoplasm of breast: Secondary | ICD-10-CM | POA: Diagnosis not present

## 2019-06-26 DIAGNOSIS — Z01419 Encounter for gynecological examination (general) (routine) without abnormal findings: Secondary | ICD-10-CM | POA: Diagnosis not present

## 2019-06-26 DIAGNOSIS — Z6837 Body mass index (BMI) 37.0-37.9, adult: Secondary | ICD-10-CM | POA: Diagnosis not present

## 2019-07-24 DIAGNOSIS — E119 Type 2 diabetes mellitus without complications: Secondary | ICD-10-CM | POA: Diagnosis not present

## 2019-10-15 DIAGNOSIS — Z20822 Contact with and (suspected) exposure to covid-19: Secondary | ICD-10-CM | POA: Diagnosis not present

## 2019-12-23 DIAGNOSIS — E669 Obesity, unspecified: Secondary | ICD-10-CM | POA: Diagnosis not present

## 2019-12-29 DIAGNOSIS — I10 Essential (primary) hypertension: Secondary | ICD-10-CM | POA: Diagnosis not present

## 2019-12-30 LAB — BASIC METABOLIC PANEL
BUN/Creatinine Ratio: 22 (ref 9–23)
BUN: 13 mg/dL (ref 6–24)
CO2: 24 mmol/L (ref 20–29)
Calcium: 9.2 mg/dL (ref 8.7–10.2)
Chloride: 99 mmol/L (ref 96–106)
Creatinine, Ser: 0.58 mg/dL (ref 0.57–1.00)
GFR calc Af Amer: 123 mL/min/{1.73_m2} (ref >59)
GFR calc non Af Amer: 107 mL/min/{1.73_m2} (ref >59)
Glucose: 228 mg/dL — ABNORMAL HIGH (ref 65–99)
Potassium: 4.4 mmol/L (ref 3.5–5.2)
Sodium: 137 mmol/L (ref 134–144)

## 2020-01-03 DIAGNOSIS — Z20822 Contact with and (suspected) exposure to covid-19: Secondary | ICD-10-CM | POA: Diagnosis not present

## 2020-01-04 DIAGNOSIS — Z20822 Contact with and (suspected) exposure to covid-19: Secondary | ICD-10-CM | POA: Diagnosis not present

## 2020-04-19 DIAGNOSIS — F330 Major depressive disorder, recurrent, mild: Secondary | ICD-10-CM | POA: Diagnosis not present

## 2020-04-19 DIAGNOSIS — I10 Essential (primary) hypertension: Secondary | ICD-10-CM | POA: Diagnosis not present

## 2020-04-19 DIAGNOSIS — E7800 Pure hypercholesterolemia, unspecified: Secondary | ICD-10-CM | POA: Diagnosis not present

## 2020-04-19 DIAGNOSIS — E1169 Type 2 diabetes mellitus with other specified complication: Secondary | ICD-10-CM | POA: Diagnosis not present

## 2020-05-03 DIAGNOSIS — L818 Other specified disorders of pigmentation: Secondary | ICD-10-CM | POA: Diagnosis not present

## 2020-10-01 DIAGNOSIS — K635 Polyp of colon: Secondary | ICD-10-CM | POA: Diagnosis not present

## 2020-10-01 DIAGNOSIS — K5730 Diverticulosis of large intestine without perforation or abscess without bleeding: Secondary | ICD-10-CM | POA: Diagnosis not present

## 2020-10-01 DIAGNOSIS — Z1211 Encounter for screening for malignant neoplasm of colon: Secondary | ICD-10-CM | POA: Diagnosis not present

## 2020-10-01 DIAGNOSIS — D123 Benign neoplasm of transverse colon: Secondary | ICD-10-CM | POA: Diagnosis not present

## 2020-10-27 DIAGNOSIS — E1169 Type 2 diabetes mellitus with other specified complication: Secondary | ICD-10-CM | POA: Diagnosis not present

## 2020-10-27 DIAGNOSIS — E7800 Pure hypercholesterolemia, unspecified: Secondary | ICD-10-CM | POA: Diagnosis not present

## 2020-10-27 DIAGNOSIS — Z23 Encounter for immunization: Secondary | ICD-10-CM | POA: Diagnosis not present

## 2020-10-27 DIAGNOSIS — Z0000 Encounter for general adult medical examination without abnormal findings: Secondary | ICD-10-CM | POA: Diagnosis not present

## 2020-10-27 DIAGNOSIS — I10 Essential (primary) hypertension: Secondary | ICD-10-CM | POA: Diagnosis not present

## 2020-11-01 DIAGNOSIS — Z1231 Encounter for screening mammogram for malignant neoplasm of breast: Secondary | ICD-10-CM | POA: Diagnosis not present

## 2020-11-01 DIAGNOSIS — Z01419 Encounter for gynecological examination (general) (routine) without abnormal findings: Secondary | ICD-10-CM | POA: Diagnosis not present

## 2020-11-01 DIAGNOSIS — Z6836 Body mass index (BMI) 36.0-36.9, adult: Secondary | ICD-10-CM | POA: Diagnosis not present

## 2021-04-27 DIAGNOSIS — F330 Major depressive disorder, recurrent, mild: Secondary | ICD-10-CM | POA: Diagnosis not present

## 2021-04-27 DIAGNOSIS — R351 Nocturia: Secondary | ICD-10-CM | POA: Diagnosis not present

## 2021-04-27 DIAGNOSIS — E1169 Type 2 diabetes mellitus with other specified complication: Secondary | ICD-10-CM | POA: Diagnosis not present

## 2021-04-27 DIAGNOSIS — I10 Essential (primary) hypertension: Secondary | ICD-10-CM | POA: Diagnosis not present

## 2021-04-27 DIAGNOSIS — E7800 Pure hypercholesterolemia, unspecified: Secondary | ICD-10-CM | POA: Diagnosis not present

## 2021-06-13 DIAGNOSIS — R7989 Other specified abnormal findings of blood chemistry: Secondary | ICD-10-CM | POA: Diagnosis not present

## 2021-06-13 DIAGNOSIS — R945 Abnormal results of liver function studies: Secondary | ICD-10-CM | POA: Diagnosis not present

## 2022-01-26 DIAGNOSIS — H1013 Acute atopic conjunctivitis, bilateral: Secondary | ICD-10-CM | POA: Diagnosis not present

## 2022-03-16 DIAGNOSIS — Z124 Encounter for screening for malignant neoplasm of cervix: Secondary | ICD-10-CM | POA: Diagnosis not present

## 2022-03-16 DIAGNOSIS — Z1231 Encounter for screening mammogram for malignant neoplasm of breast: Secondary | ICD-10-CM | POA: Diagnosis not present

## 2022-03-16 DIAGNOSIS — Z6836 Body mass index (BMI) 36.0-36.9, adult: Secondary | ICD-10-CM | POA: Diagnosis not present

## 2022-03-16 DIAGNOSIS — Z1151 Encounter for screening for human papillomavirus (HPV): Secondary | ICD-10-CM | POA: Diagnosis not present

## 2022-03-16 DIAGNOSIS — Z01419 Encounter for gynecological examination (general) (routine) without abnormal findings: Secondary | ICD-10-CM | POA: Diagnosis not present

## 2022-03-29 DIAGNOSIS — M545 Low back pain, unspecified: Secondary | ICD-10-CM | POA: Diagnosis not present

## 2022-04-21 DIAGNOSIS — Z0000 Encounter for general adult medical examination without abnormal findings: Secondary | ICD-10-CM | POA: Diagnosis not present

## 2022-04-21 DIAGNOSIS — F330 Major depressive disorder, recurrent, mild: Secondary | ICD-10-CM | POA: Diagnosis not present

## 2022-04-21 DIAGNOSIS — Z23 Encounter for immunization: Secondary | ICD-10-CM | POA: Diagnosis not present

## 2022-04-21 DIAGNOSIS — E1169 Type 2 diabetes mellitus with other specified complication: Secondary | ICD-10-CM | POA: Diagnosis not present

## 2022-04-21 DIAGNOSIS — E7800 Pure hypercholesterolemia, unspecified: Secondary | ICD-10-CM | POA: Diagnosis not present

## 2022-04-21 DIAGNOSIS — I10 Essential (primary) hypertension: Secondary | ICD-10-CM | POA: Diagnosis not present

## 2023-04-06 DIAGNOSIS — F330 Major depressive disorder, recurrent, mild: Secondary | ICD-10-CM | POA: Diagnosis not present

## 2023-04-06 DIAGNOSIS — Z0000 Encounter for general adult medical examination without abnormal findings: Secondary | ICD-10-CM | POA: Diagnosis not present

## 2023-04-06 DIAGNOSIS — E1169 Type 2 diabetes mellitus with other specified complication: Secondary | ICD-10-CM | POA: Diagnosis not present

## 2023-04-06 DIAGNOSIS — E7800 Pure hypercholesterolemia, unspecified: Secondary | ICD-10-CM | POA: Diagnosis not present

## 2023-04-06 DIAGNOSIS — I10 Essential (primary) hypertension: Secondary | ICD-10-CM | POA: Diagnosis not present

## 2024-01-29 DIAGNOSIS — H109 Unspecified conjunctivitis: Secondary | ICD-10-CM | POA: Diagnosis not present

## 2024-01-29 DIAGNOSIS — H538 Other visual disturbances: Secondary | ICD-10-CM | POA: Diagnosis not present

## 2024-01-29 DIAGNOSIS — E1169 Type 2 diabetes mellitus with other specified complication: Secondary | ICD-10-CM | POA: Diagnosis not present
